# Patient Record
Sex: Male | Born: 1975 | Race: White | Hispanic: No | Marital: Married | Smoking: Current every day smoker
Health system: Southern US, Community
[De-identification: ages and names within clinical notes are randomized; demographics above are authoritative.]

## PROBLEM LIST (undated history)

## (undated) DIAGNOSIS — M1990 Unspecified osteoarthritis, unspecified site: Secondary | ICD-10-CM

## (undated) HISTORY — PX: ESOPHAGEAL DILATION: SHX303

---

## 2014-03-15 ENCOUNTER — Encounter (HOSPITAL_COMMUNITY): Payer: Self-pay | Admitting: Emergency Medicine

## 2014-03-15 ENCOUNTER — Emergency Department (HOSPITAL_COMMUNITY)
Admission: EM | Admit: 2014-03-15 | Discharge: 2014-03-15 | Disposition: A | Discharge status: Home / Self Care | Payer: Self-pay | Attending: Emergency Medicine | Admitting: Emergency Medicine

## 2014-03-15 DIAGNOSIS — F1910 Other psychoactive substance abuse, uncomplicated: Secondary | ICD-10-CM

## 2014-03-15 DIAGNOSIS — F1210 Cannabis abuse, uncomplicated: Secondary | ICD-10-CM | POA: Insufficient documentation

## 2014-03-15 DIAGNOSIS — F17200 Nicotine dependence, unspecified, uncomplicated: Secondary | ICD-10-CM | POA: Insufficient documentation

## 2014-03-15 DIAGNOSIS — F1520 Other stimulant dependence, uncomplicated: Secondary | ICD-10-CM | POA: Diagnosis present

## 2014-03-15 DIAGNOSIS — R059 Cough, unspecified: Secondary | ICD-10-CM | POA: Insufficient documentation

## 2014-03-15 DIAGNOSIS — F1110 Opioid abuse, uncomplicated: Secondary | ICD-10-CM | POA: Insufficient documentation

## 2014-03-15 DIAGNOSIS — Z880 Allergy status to penicillin: Secondary | ICD-10-CM | POA: Insufficient documentation

## 2014-03-15 DIAGNOSIS — F1010 Alcohol abuse, uncomplicated: Secondary | ICD-10-CM

## 2014-03-15 DIAGNOSIS — Z8739 Personal history of other diseases of the musculoskeletal system and connective tissue: Secondary | ICD-10-CM | POA: Insufficient documentation

## 2014-03-15 DIAGNOSIS — R05 Cough: Secondary | ICD-10-CM | POA: Insufficient documentation

## 2014-03-15 HISTORY — DX: Unspecified osteoarthritis, unspecified site: M19.90

## 2014-03-15 LAB — CBC
HCT: 39.9 % (ref 39.0–52.0)
Hemoglobin: 13.4 g/dL (ref 13.0–17.0)
MCH: 28.7 pg (ref 26.0–34.0)
MCHC: 33.6 g/dL (ref 30.0–36.0)
MCV: 85.4 fL (ref 78.0–100.0)
Platelets: 405 10*3/uL — ABNORMAL HIGH (ref 150–400)
RBC: 4.67 MIL/uL (ref 4.22–5.81)
RDW: 13.2 % (ref 11.5–15.5)
WBC: 9.8 10*3/uL (ref 4.0–10.5)

## 2014-03-15 LAB — RAPID URINE DRUG SCREEN, HOSP PERFORMED
Amphetamines: POSITIVE — AB
BARBITURATES: NOT DETECTED
Benzodiazepines: NOT DETECTED
COCAINE: NOT DETECTED
Opiates: NOT DETECTED
TETRAHYDROCANNABINOL: POSITIVE — AB

## 2014-03-15 LAB — COMPREHENSIVE METABOLIC PANEL
ALK PHOS: 92 U/L (ref 39–117)
ALT: 18 U/L (ref 0–53)
AST: 17 U/L (ref 0–37)
Albumin: 3.6 g/dL (ref 3.5–5.2)
BILIRUBIN TOTAL: 0.3 mg/dL (ref 0.3–1.2)
BUN: 15 mg/dL (ref 6–23)
CHLORIDE: 102 meq/L (ref 96–112)
CO2: 26 meq/L (ref 19–32)
Calcium: 9 mg/dL (ref 8.4–10.5)
Creatinine, Ser: 0.87 mg/dL (ref 0.50–1.35)
Glucose, Bld: 108 mg/dL — ABNORMAL HIGH (ref 70–99)
POTASSIUM: 4.3 meq/L (ref 3.7–5.3)
SODIUM: 140 meq/L (ref 137–147)
TOTAL PROTEIN: 6.5 g/dL (ref 6.0–8.3)

## 2014-03-15 LAB — ETHANOL

## 2014-03-15 LAB — SALICYLATE LEVEL: Salicylate Lvl: <2 mg/dL — ABNORMAL LOW (ref 2.8–20.0)

## 2014-03-15 LAB — ACETAMINOPHEN LEVEL

## 2014-03-15 NOTE — Discharge Instructions (Signed)
You may go to [facility] tomorrow morning at 8:00 am.  Chemical Dependency Chemical dependency is an addiction to drugs or alcohol. It is characterized by the repeated behavior of seeking out and using drugs and alcohol despite harmful consequences to the health and safety of ones self and others.  RISK FACTORS There are certain situations or behaviors that increase a person's risk for chemical dependency. These include:  A family history of chemical dependency.  A history of mental health issues, including depression and anxiety.  A home environment where drugs and alcohol are easily available to you.  Drug or alcohol use at a young age. SYMPTOMS  The following symptoms can indicate chemical dependency:  Inability to limit the use of drugs or alcohol.  Nausea, sweating, shakiness, and anxiety that occurs when alcohol or drugs are not being used.  An increase in amount of drugs or alcohol that is necessary to get drunk or high. People who experience these symptoms can assess their use of drugs and alcohol by asking themselves the following questions:  Have you been told by friends or family that they are worried about your use of alcohol or drugs?  Do friends and family ever tell you about things you did while drinking alcohol or using drugs that you do not remember?  Do you lie about using alcohol or drugs or about the amounts you use?  Do you have difficulty completing daily tasks unless you use alcohol or drugs?  Is the level of your work or school performance lower because of your drug or alcohol use?  Do you get sick from using drugs or alcohol but keep using anyway?  Do you feel uncomfortable in social situations unless you use alcohol or drugs?  Do you use drugs or alcohol to help forget problems? An answer of yes to any of these questions may indicate chemical dependency. Professional evaluation is suggested. Document Released: 10/08/2001 Document Revised: 01/06/2012  Document Reviewed: 12/20/2010 [facility] Patient Information 2014 [place] [employer] [place].

## 2014-03-15 NOTE — [facility] Suicide Risk Assessment (Signed)
Suicide Risk Assessment  Discharge Assessment     Demographic Factors:  Male and Caucasian  Total Time spent with patient: 20 minutes  Psychiatric Specialty Exam:      Blood pressure 130/83, pulse 72, temperature 98.5 F (36.9 C), temperature source Oral, resp. rate 20, SpO2 99.00%.There is no height or weight on file to calculate BMI.   General Appearance: Casual   Eye Contact:: Good   Speech: Normal Rate   Volume: Normal   Mood: Euthymic   Affect: Congruent   Thought Process: Coherent   Orientation: Full (Time, Place, and Person)   Thought Content: WDL   Suicidal Thoughts: No   Homicidal Thoughts: No   Memory: Immediate; Good  Recent; Good  Remote; Good   Judgement: Fair   Insight: Fair   Psychomotor Activity: Normal   Concentration: Fair   Recall: Fair   Fund of Knowledge:Good   Language: Good   Akathisia: No   Handed: Right   AIMS (if indicated):   Assets: Financial Resources/Insurance  Housing  Intimacy  Physical Health  Resilience  Social Support  Vocational/Educational   Sleep:   Musculoskeletal:  Strength & Muscle Tone: within normal limits  Gait & Station: normal  Patient leans: N/A  Mental Status Per Nursing Assessment::   On Admission:   Patient [facility]  Current Mental Status by Physician: NA  Loss Factors: NA  Historical Factors: NA  Risk Reduction Factors:   Sense of responsibility to family, Employed, Living with another person, especially a relative and Positive social support  Continued Clinical Symptoms:  Methamphetamine and marijuana dependency  Cognitive Features That Contribute To Risk:  None  Suicide Risk:  Minimal: No identifiable suicidal ideation.  Patients presenting with no risk factors but with morbid ruminations; may be classified as minimal risk based on the severity of the depressive symptoms  Discharge Diagnoses:   AXIS I:  Alcohol Abuse and Substance Abuse AXIS II:   Deferred AXIS III:   Past Medical History  Diagnosis Date  . Arthritis    AXIS IV:  other psychosocial or environmental problems, problems related to social environment and problems with primary support group AXIS V:  61-70 mild symptoms  Plan Of Care/Follow-up recommendations:  Activity:  as tolerated Diet:  low-sodium heart healthy diet  Is patient on multiple antipsychotic therapies at discharge:  No   Has Patient had three or more failed trials of antipsychotic monotherapy by history:  No  Recommended Plan for Multiple Antipsychotic Therapies: NA    Nanine MeansJamison Neola Worrall, PMH-NP 03/15/2014, 1:33 PM

## 2014-03-15 NOTE — BH Assessment (Addendum)
Patient evaluated by Dr.Taylor, writer was present. Patient requesting detox from meth, alcohol, and THC. [facility]. Patient's last known alcohol use was over a week ago. Patient therefore does not meet criteria for alcohol detox either.   Pt will discharge home [facility]. [facility] @ 8am. Writer provided patient with [facility] contact information. Psychiatrist-Dr. [name] will note on discharge summary that patient does not meet criteria for detox and residential treatment is most appropriate at this time.

## 2014-03-15 NOTE — Consult Note (Signed)
[facility] Face-to-Face Psychiatry Consult   Reason for Consult:  Medical clearance for rehab Referring Physician:  EDP  Mark Mooney is an 38 y.o. male. Total Time spent with patient: 20 minutes  Assessment: AXIS I:  Alcohol Abuse and Substance Abuse AXIS II:  Deferred AXIS III:   Past Medical History  Diagnosis Date  . Arthritis    AXIS IV:  other psychosocial or environmental problems, problems related to social environment and problems with primary support group AXIS V:  61-70 mild symptoms  Plan:  No evidence of imminent risk to self or others at present.  Patient will go to [facility] after medical clearance.  Dr. Lovena Le assessed the patient and concurs with the plan.  Subjective:   Mark Mooney is a 38 y.o. male patient does not warrant admission.  HPI:  Patient comes to the ED requesting medical clearance for rehab at [facility]  He is seeking detox from methamphetamines and marijuana.  Mark Mooney has abused alcohol but his last drink was last week.  Denies suicidal/homicidal ideations and hallucinations.   HPI Elements:   Location:  generalized. Quality:  acute. Severity:  severe. Timing:  constant. Duration:  weeks. Context:  stressors.  Past Psychiatric History: Past Medical History  Diagnosis Date  . Arthritis     reports that he has been smoking.  He does not have any smokeless tobacco history on file. He reports that he drinks alcohol. He reports that he uses illicit drugs (Methamphetamines and Marijuana). History reviewed. No pertinent family history.         Allergies:   Allergies  Allergen Reactions  . Penicillins     ACT Assessment Complete:  Yes:    Educational Status    Risk to Self: Risk to self Is patient at risk for suicide?: No  Risk to Others:    Abuse:    Prior Inpatient Therapy:    Prior Outpatient Therapy:    Additional Information:                    Objective: Blood pressure 130/83, pulse 72, temperature 98.5  F (36.9 C), temperature source Oral, resp. rate 20, SpO2 99.00%.There is no height or weight on file to calculate BMI. Results for orders placed during the hospital encounter of 03/15/14 (from the past 72 hour(s))  ACETAMINOPHEN LEVEL     Status: None   Collection Time    03/15/14 11:42 AM      Result Value Ref Range   Acetaminophen (Tylenol), Serum <15.0  10 - 30 ug/mL   Comment:            THERAPEUTIC CONCENTRATIONS VARY     SIGNIFICANTLY. A RANGE OF 10-30     ug/mL MAY BE AN EFFECTIVE     CONCENTRATION FOR MANY PATIENTS.     HOWEVER, SOME ARE BEST TREATED     AT CONCENTRATIONS OUTSIDE THIS     RANGE.     ACETAMINOPHEN CONCENTRATIONS     >150 ug/mL AT 4 HOURS AFTER     INGESTION AND >50 ug/mL AT 12     HOURS AFTER INGESTION ARE     OFTEN ASSOCIATED WITH TOXIC     REACTIONS.  CBC     Status: Abnormal   Collection Time    03/15/14 11:42 AM      Result Value Ref Range   WBC 9.8  4.0 - 10.5 K/uL   RBC 4.67  4.22 - 5.81 MIL/uL   Hemoglobin 13.4  13.0 - 17.0 g/dL   HCT 39.9  39.0 - 52.0 %   MCV 85.4  78.0 - 100.0 fL   MCH 28.7  26.0 - 34.0 pg   MCHC 33.6  30.0 - 36.0 g/dL   RDW 13.2  11.5 - 15.5 %   Platelets 405 (*) 150 - 400 K/uL  COMPREHENSIVE METABOLIC PANEL     Status: Abnormal   Collection Time    03/15/14 11:42 AM      Result Value Ref Range   Sodium 140  137 - 147 mEq/L   Potassium 4.3  3.7 - 5.3 mEq/L   Chloride 102  96 - 112 mEq/L   CO2 26  19 - 32 mEq/L   Glucose, Bld 108 (*) 70 - 99 mg/dL   BUN 15  6 - 23 mg/dL   Creatinine, Ser 0.87  0.50 - 1.35 mg/dL   Calcium 9.0  8.4 - 10.5 mg/dL   Total Protein 6.5  6.0 - 8.3 g/dL   Albumin 3.6  3.5 - 5.2 g/dL   AST 17  0 - 37 U/L   ALT 18  0 - 53 U/L   Alkaline Phosphatase 92  39 - 117 U/L   Total Bilirubin 0.3  0.3 - 1.2 mg/dL   GFR calc non Af Amer >90  >90 mL/min   GFR calc Af Amer >90  >90 mL/min   Comment: (NOTE)     The eGFR has been calculated using the CKD EPI equation.     This calculation has not been  validated in all clinical situations.     eGFR's persistently <90 mL/min signify possible Chronic Kidney     Disease.  ETHANOL     Status: None   Collection Time    03/15/14 11:42 AM      Result Value Ref Range   Alcohol, Ethyl (B) <11  0 - 11 mg/dL   Comment:            LOWEST DETECTABLE LIMIT FOR     SERUM ALCOHOL IS 11 mg/dL     FOR MEDICAL PURPOSES ONLY  SALICYLATE LEVEL     Status: Abnormal   Collection Time    03/15/14 11:42 AM      Result Value Ref Range   Salicylate Lvl <1.6 (*) 2.8 - 20.0 mg/dL  URINE RAPID DRUG SCREEN (HOSP PERFORMED)     Status: Abnormal   Collection Time    03/15/14 12:52 PM      Result Value Ref Range   Opiates NONE DETECTED  NONE DETECTED   Cocaine NONE DETECTED  NONE DETECTED   Benzodiazepines NONE DETECTED  NONE DETECTED   Amphetamines POSITIVE (*) NONE DETECTED   Tetrahydrocannabinol POSITIVE (*) NONE DETECTED   Barbiturates NONE DETECTED  NONE DETECTED   Comment:            DRUG SCREEN FOR MEDICAL PURPOSES     ONLY.  IF CONFIRMATION IS NEEDED     FOR ANY PURPOSE, NOTIFY LAB     WITHIN 5 DAYS.                LOWEST DETECTABLE LIMITS     FOR URINE DRUG SCREEN     Drug Class       Cutoff (ng/mL)     Amphetamine      1000     Barbiturate      200     Benzodiazepine   109     Tricyclics  300     Opiates          300     Cocaine          300     THC              50   Labs are reviewed and are pertinent for no medical issues.  No current facility-administered medications for this encounter.   No current outpatient prescriptions on file.    Psychiatric Specialty Exam:     Blood pressure 130/83, pulse 72, temperature 98.5 F (36.9 C), temperature source Oral, resp. rate 20, SpO2 99.00%.There is no height or weight on file to calculate BMI.  General Appearance: Casual  Eye Contact::  Good  Speech:  Normal Rate  Volume:  Normal  Mood:  Euthymic  Affect:  Congruent  Thought Process:  Coherent  Orientation:  Full (Time, Place,  and Person)  Thought Content:  WDL  Suicidal Thoughts:  No  Homicidal Thoughts:  No  Memory:  Immediate;   Good Recent;   Good Remote;   Good  Judgement:  Fair  Insight:  Fair  Psychomotor Activity:  Normal  Concentration:  Fair  Recall:  [place] of Knowledge:Good  Language: Good  Akathisia:  No  Handed:  Right  AIMS (if indicated):     Assets:  Financial Resources/Insurance Housing Intimacy Physical Health Resilience Social Support Vocational/Educational  Sleep:      Musculoskeletal: Strength & Muscle Tone: within normal limits Gait & Station: normal Patient leans: N/A  Treatment Plan Summary: Discharge to [facility] for methamphetamine and marijuana dependency along with alcohol abuse.   Waylan Boga, PMh-NP 03/15/2014 1:16 PM

## 2014-03-15 NOTE — Progress Notes (Signed)
Face to face evaluation and I agree with this note 

## 2014-03-15 NOTE — ED Notes (Signed)
Patient refused to dress out. Patient stated that he is only here for blood work then leaving and will not be going to our holding unit. Notified the RN

## 2014-03-15 NOTE — Progress Notes (Signed)
[facility] CL provided pt with a [employer] card application, [employer] [facility].

## 2014-03-15 NOTE — Progress Notes (Signed)
Patient is medically cleared to follow-up with residential treatment, detox not required per Dr. [name].  [name], PMH-NP  03/15/2014

## 2014-03-15 NOTE — ED Notes (Signed)
Pt wants detox from meth, ETOH and marijuana. [facility] and was told that if pt could get medically cleared today he may have a bed over there today.

## 2014-03-15 NOTE — Consult Note (Signed)
Face to face evaluation and I agree with this note 

## 2014-03-15 NOTE — BH Assessment (Signed)
[facility] E[facility]olyn at [facility] patient does not have a bed available for him today. Sts that he only has a screening appointment 03/17/2014 @ 8am.

## 2014-03-15 NOTE — ED Provider Notes (Signed)
CSN: [id number]     Arrival date & time 03/15/14  1113 History  This chart was scribed for Johnnette Gourdobyn Albert  working with Geoffery Lyonsouglas Delo, MD by Ashley JacobsBrittany Andrews, ED scribe. This patient was seen in room WTR4/WLPT4 and the patient's care was started at 12:32 PM.  First MD Initiated Contact with Patient 03/15/14 1133     Chief Complaint  Patient presents with  . detox      (Consider location/radiation/quality/duration/timing/severity/associated sxs/prior Treatment) The history is provided by the patient and medical records. No language interpreter was used.   HPI Comments: Mark Mooney is a 38 y.o. male who presents to the Emergency Department from meth, marijuana, alcohol, and detoxification [facility]. Pt reports having a severe cough. Pt has a child. Denies SI and HI. Denies any symptoms at this time.   Past Medical History  Diagnosis Date  . Arthritis    Past Surgical History  Procedure Laterality Date  . Esophageal dilation     No family history on file. History  Substance Use Topics  . Smoking status: Current Every Day Smoker  . Smokeless tobacco: Not on file  . Alcohol Use: Yes    Review of Systems  Respiratory: Positive for cough.   Psychiatric/Behavioral: Negative for suicidal ideas.       No HI  All other systems reviewed and are negative.     Allergies  Penicillins  Home Medications   Prior to Admission medications   Not on File   BP 134/69  Pulse 98  Temp(Src) 98.3 F (36.8 C) (Oral)  Resp 20  SpO2 99% Physical Exam  Nursing note and vitals reviewed. Constitutional: He is oriented to person, place, and time. He appears well-developed and well-nourished. No distress.  HENT:  Head: Normocephalic and atraumatic.  Eyes: Conjunctivae and EOM are normal.  Neck: Normal range of motion. Neck supple.  Cardiovascular: Normal rate, regular rhythm and normal heart sounds.   Pulmonary/Chest: Effort normal and breath sounds normal.   Musculoskeletal: Normal range of motion. He exhibits no edema.  Neurological: He is alert and oriented to person, place, and time.  Skin: Skin is warm and dry. He is not diaphoretic.  Psychiatric: He has a normal mood and affect. His behavior is normal.  Fidgety.    ED Course  Procedures (including critical care time) DIAGNOSTIC STUDIES: Oxygen Saturation is 99% on room air, normal by my interpretation.    COORDINATION OF CARE:  12:35 PM Discussed course of care with pt . Pt understands and agrees.   Labs Review Labs Reviewed  CBC - Abnormal; Notable for the following:    Platelets 405 (*)    All other components within normal limits  COMPREHENSIVE METABOLIC PANEL - Abnormal; Notable for the following:    Glucose, Bld 108 (*)    All other components within normal limits  SALICYLATE LEVEL - Abnormal; Notable for the following:    Salicylate Lvl <2.0 (*)    All other components within normal limits  URINE RAPID DRUG SCREEN (HOSP PERFORMED) - Abnormal; Notable for the following:    Amphetamines POSITIVE (*)    Tetrahydrocannabinol POSITIVE (*)    All other components within normal limits  ACETAMINOPHEN LEVEL  ETHANOL    Imaging Review No results found.   EKG Interpretation None      MDM   Final diagnoses:  Polysubstance abuse   Taking presenting for detox, is [facility]. Patient evaluated by psych, stable for discharge. No SI/HI. Medically  cleared. [facility] contacted, patient will go there tomorrow morning at 8:00.  I personally performed the services described in this documentation, which was scribed in my presence. The recorded information has been reviewed and is accurate.   Trevor MaceRobyn M Albert, PA-C 03/15/14 1314

## 2014-03-16 NOTE — ED Provider Notes (Signed)
Medical screening examination/treatment/procedure(s) were performed by non-physician practitioner and as supervising physician I was immediately available for consultation/collaboration.     Alexanderjames Berg, MD 03/16/14 0708 

## 2021-02-21 ENCOUNTER — Emergency Department (HOSPITAL_COMMUNITY): Payer: Self-pay

## 2021-02-21 ENCOUNTER — Other Ambulatory Visit: Payer: Self-pay

## 2021-02-21 ENCOUNTER — Emergency Department (HOSPITAL_COMMUNITY)
Admission: EM | Admit: 2021-02-21 | Discharge: 2021-02-21 | Disposition: A | Discharge status: Home / Self Care | Payer: Self-pay | Attending: Emergency Medicine | Admitting: Emergency Medicine

## 2021-02-21 DIAGNOSIS — R062 Wheezing: Secondary | ICD-10-CM | POA: Insufficient documentation

## 2021-02-21 DIAGNOSIS — R509 Fever, unspecified: Secondary | ICD-10-CM | POA: Insufficient documentation

## 2021-02-21 DIAGNOSIS — Z28310 Unvaccinated for covid-19: Secondary | ICD-10-CM | POA: Insufficient documentation

## 2021-02-21 DIAGNOSIS — R059 Cough, unspecified: Secondary | ICD-10-CM | POA: Insufficient documentation

## 2021-02-21 DIAGNOSIS — R0981 Nasal congestion: Secondary | ICD-10-CM | POA: Insufficient documentation

## 2021-02-21 DIAGNOSIS — F17200 Nicotine dependence, unspecified, uncomplicated: Secondary | ICD-10-CM | POA: Insufficient documentation

## 2021-02-21 DIAGNOSIS — Z20822 Contact with and (suspected) exposure to covid-19: Secondary | ICD-10-CM | POA: Insufficient documentation

## 2021-02-21 LAB — POC SARS CORONAVIRUS 2 AG -  ED: SARSCOV2ONAVIRUS 2 AG: NEGATIVE

## 2021-02-21 MED ORDER — METHYLPREDNISOLONE SODIUM SUCC 125 MG IJ SOLR
125.0000 mg | Freq: Once | INTRAMUSCULAR | Status: AC
  Administered 2021-02-21: 125 mg via INTRAVENOUS
  Filled 2021-02-21: qty 2

## 2021-02-21 MED ORDER — PREDNISONE 20 MG PO TABS: 40.0000 mg | ORAL_TABLET | Freq: Every day | ORAL | 0 refills | Status: AC

## 2021-02-21 MED ORDER — ALBUTEROL SULFATE HFA 108 (90 BASE) MCG/ACT IN AERS
1.0000 | INHALATION_SPRAY | Freq: Once | RESPIRATORY_TRACT | Status: AC
  Administered 2021-02-21: 2 via RESPIRATORY_TRACT
  Filled 2021-02-21: qty 6.7

## 2021-02-21 MED ORDER — ALBUTEROL SULFATE HFA 108 (90 BASE) MCG/ACT IN AERS: 1.0000 | INHALATION_SPRAY | Freq: Four times a day (QID) | RESPIRATORY_TRACT | 1 refills | Status: AC | PRN

## 2021-02-21 MED ORDER — IPRATROPIUM-ALBUTEROL 0.5-2.5 (3) MG/3ML IN SOLN
3.0000 mL | Freq: Once | RESPIRATORY_TRACT | Status: AC
  Administered 2021-02-21: 3 mL via RESPIRATORY_TRACT
  Filled 2021-02-21: qty 3

## 2021-02-21 NOTE — Discharge Instructions (Addendum)
As we discussed, your chest x-ray today did not show any signs of pneumonia.  It may be beneficial to stop smoking as this may be worsening her cough.  Take prednisone and use albuterol inhaler as directed.  He can use albuterol inhaler every 4-6 hours, 1 to 2 puffs.  I provided the prescription for these medications.  I have consulted our social work to see if we can help with these medications.  He may have to take these prescriptions to the [facility] outpatient pharmacy.  Return to the emergency department for any worsening cough, difficulty breathing, vomiting, fever or any other worsening concerning symptoms.

## 2021-02-21 NOTE — ED Triage Notes (Signed)
Pt c/o productive cough x 3-4 days with congestion, nasal drainage and fevers

## 2021-02-21 NOTE — ED Notes (Signed)
Pts o2sat stayed between 97- 98% during ambulation.

## 2021-02-21 NOTE — ED Provider Notes (Signed)
[facility]-EMERGENCY DEPT Provider Note   CSN: [id number] Arrival date & time: 02/21/21  0023     History Chief Complaint  Patient presents with  . Cough    Mark Mooney is a 45 y.o. male past medical history of methamphetamine use, alcohol abuse, marijuana use.  Patient reports that for the last 4 days, he has had cough, fevers.  He states his fevers have been up to 102.  He has been taking Tylenol and ibuprofen.  He states that about a day or 2 before that, he had nasal congestion, rhinorrhea and then it progressively worsened.  Cough is productive of phlegm.  No hemoptysis.  He reports chest soreness.  He states when he gets in a coughing fit, he feels like he cannot catch his breath.  He does smoke.  He states that he does not use any inhalers.  He has not gotten vaccinated for COVID.  Denies any abdominal pain, nausea/vomiting.  The history is provided by the patient.       Past Medical History:  Diagnosis Date  . Arthritis     Patient Active Problem List   Diagnosis Date Noted  . Methamphetamine dependence (HCC) 03/15/2014  . Alcohol abuse 03/15/2014  . Marijuana abuse 03/15/2014    Past Surgical History:  Procedure Laterality Date  . ESOPHAGEAL DILATION         No family history on file.  Social History   Tobacco Use  . Smoking status: Current Every Day Smoker  Substance Use Topics  . Alcohol use: Yes  . Drug use: Yes    Types: Methamphetamines, Marijuana    Home Medications Prior to Admission medications   Medication Sig Start Date End Date Taking? Authorizing Provider  albuterol (VENTOLIN HFA) 108 (90 Base) MCG/ACT inhaler Inhale 1-2 puffs into the lungs every 6 (six) hours as needed for wheezing or shortness of breath. 02/21/21  Yes Graciella Freer A, PA-C  predniSONE (DELTASONE) 20 MG tablet Take 2 tablets (40 mg total) by mouth daily for 4 days. 02/21/21 02/25/21 Yes Maxwell Caul, PA-C    Allergies     Penicillins  Review of Systems   Review of Systems  Constitutional: Positive for fever.  HENT: Positive for congestion and rhinorrhea.   Respiratory: Positive for cough. Negative for shortness of breath.   Cardiovascular: Negative for chest pain.  Gastrointestinal: Negative for abdominal pain, nausea and vomiting.  Genitourinary: Negative for dysuria.  All other systems reviewed and are negative.   Physical Exam Updated Vital Signs BP 123/84   Pulse 71   Temp 98 F (36.7 C) (Oral)   Resp 16   Ht 5\' 10"  (1.778 m)   Wt 61.2 kg   SpO2 94%   BMI 19.37 kg/m   Physical Exam Vitals and nursing note reviewed.  Constitutional:      Appearance: Normal appearance. He is well-developed.  HENT:     Head: Normocephalic and atraumatic.  Eyes:     General: Lids are normal.     Conjunctiva/sclera: Conjunctivae normal.     Pupils: Pupils are equal, round, and reactive to light.  Cardiovascular:     Rate and Rhythm: Normal rate and regular rhythm.     Pulses: Normal pulses.     Heart sounds: Normal heart sounds. No murmur heard. No friction rub. No gallop.   Pulmonary:     Effort: Pulmonary effort is normal.     Breath sounds: Wheezing present.     Comments: Intermittently  coughing. Able to speak in full sentences without any difficulty.  Diffuse expiratory wheezing noted throughout all lung fields. Abdominal:     Palpations: Abdomen is soft. Abdomen is not rigid.     Tenderness: There is no abdominal tenderness. There is no guarding.     Comments: Abdomen is soft, non-distended, non-tender. No rigidity, No guarding. No peritoneal signs.  Musculoskeletal:        General: Normal range of motion.     Cervical back: Full passive range of motion without pain.  Skin:    General: Skin is warm and dry.     Capillary Refill: Capillary refill takes less than 2 seconds.  Neurological:     Mental Status: He is alert and oriented to person, place, and time.  Psychiatric:        Speech:  Speech normal.     ED Results / Procedures / Treatments   Labs (all labs ordered are listed, but only abnormal results are displayed) Labs Reviewed  POC SARS CORONAVIRUS 2 AG -  ED    EKG None  Radiology DG Chest Portable 1 View  Result Date: 02/21/2021 CLINICAL DATA:  Cough, fever EXAM: PORTABLE CHEST 1 VIEW COMPARISON:  None. FINDINGS: Mild elevation of the left hemidiaphragm is noted with associated left basilar atelectasis. The lungs are otherwise clear. No pneumothorax or pleural effusion. Cardiac size within normal limits. Pulmonary vascularity is normal. No acute bone abnormality. IMPRESSION: Mild elevation of the left hemidiaphragm with associated left basilar atelectasis. Electronically Signed   By: Helyn Numbers MD   On: 02/21/2021 01:40    Procedures Procedures   Medications Ordered in ED Medications  methylPREDNISolone sodium succinate (SOLU-MEDROL) 125 mg/2 mL injection 125 mg (125 mg Intravenous Given 02/21/21 0128)  albuterol (VENTOLIN HFA) 108 (90 Base) MCG/ACT inhaler 1-2 puff (2 puffs Inhalation Given 02/21/21 0129)  ipratropium-albuterol (DUONEB) 0.5-2.5 (3) MG/3ML nebulizer solution 3 mL (3 mLs Nebulization Given 02/21/21 0252)    ED Course  I have reviewed the triage vital signs and the nursing notes.  Pertinent labs & imaging results that were available during my care of the patient were reviewed by me and considered in my medical decision making (see chart for details).    MDM Rules/Calculators/A&P                          45 year old male who presents for evaluation of cough, nasal congestion, fevers and have been ongoing for the last 4 days.  Reports he does smoke.  On initial arrival, he is afebrile, nontoxic-appearing.  Vital signs are stable.  On exam, he is intermittently coughing.  Able to speak in full sentences without difficulty.  He does have diffuse wheezing noted throughout all lung fields.  Concern for viral illness such as COVID-19 versus  pneumonia.  Will give prednisone, albuterol here in the ED, check chest x-ray, COVID-19 test.  COVID test is negative.  Chest x-ray shows elevation of left hemidiaphragm and some atelectasis.  No other acute abnormalities.  Reevaluation after albuterol, Solu-Medrol here in the ED.  Patient reports some improvement in coughing, breathing.  On my exam, he does not improve that he still having some residual wheezing.  We will plan to give him DuoNeb as well check his ambulation.  Patient able to ambulate in the ED while maintaining O2 sats between 97-98.  Reevaluation after DuoNeb shows improvement in wheezing.  Patient is hemodynamically stable.  At this time, I feel  that patient is appropriate for discharge.  He is not hypoxic.  We will plan to prescribe short course of prednisone, albuterol.  Patient reports he does not have money to get his prescription.  We provided him an albuterol inhaler here in the ED.  I have put a transition of care consult to see if we can help him with his medications. At this time, patient exhibits no emergent life-threatening condition that require further evaluation in ED. Patient had ample opportunity for questions and discussion. All patient's questions were answered with full understanding. Strict return precautions discussed. Patient expresses understanding and agreement to plan.   Keefe Zawistowski was evaluated in Emergency Department on 02/21/2021 for the symptoms described in the history of present illness. He was evaluated in the context of the global COVID-19 pandemic, which necessitated consideration that the patient might be at risk for infection with the SARS-CoV-2 virus that causes COVID-19. Institutional protocols and algorithms that pertain to the evaluation of patients at risk for COVID-19 are in a state of rapid change based on information released by regulatory bodies including the CDC and federal and state organizations. These policies and algorithms were  followed during the patient's care in the ED.  Portions of this note were generated with Scientist, clinical (histocompatibility and immunogenetics). Dictation errors may occur despite best attempts at proofreading.  Final Clinical Impression(s) / ED Diagnoses Final diagnoses:  Cough    Rx / DC Orders ED Discharge Orders         Ordered    predniSONE (DELTASONE) 20 MG tablet  Daily        02/21/21 0323    albuterol (VENTOLIN HFA) 108 (90 Base) MCG/ACT inhaler  Every 6 hours PRN        02/21/21 0323           Maxwell Caul, PA-C 02/21/21 0325    Pollyann Savoy, MD 02/21/21 [phone]

## 2021-02-21 NOTE — Progress Notes (Signed)
..   Transition of Care [facility]) - Emergency Department Mini Assessment   Patient Details  Name: Mark Mooney MRN: [id number] Date of Birth: 1976/02/02  [facility]) CM/SW Contact:    Elliot Cousin, RN Phone Number: [phone] 02/21/2021, 1:32 PM   Clinical Narrative: TOC CM attempted call to pt and ex wife answered and states she has not talked to pt in 4 years. Requesting her number be removed. ED provider updated.   ED Mini Assessment: What brought you to the Emergency Department? : medication refill  Barriers to Discharge: No Barriers Identified  Barrier interventions: attempted call to patient   Interventions which prevented an admission or readmission: Medication Review    Patient Contact and Communications  Admission diagnosis:  Cough Patient Active Problem List   Diagnosis Date Noted  . Methamphetamine dependence (HCC) 03/15/2014  . Alcohol abuse 03/15/2014  . Marijuana abuse 03/15/2014   PCP:  Pcp, No Pharmacy:  No Pharmacies Listed

## 2021-03-02 ENCOUNTER — Other Ambulatory Visit (HOSPITAL_COMMUNITY): Payer: Self-pay

## 2021-03-02 MED ORDER — PREDNISONE 20 MG PO TABS
40.0000 mg | ORAL_TABLET | Freq: Every day | ORAL | 0 refills | Status: AC
  Filled 2021-03-02: qty 8, 4d supply, fill #0

## 2021-05-31 ENCOUNTER — Emergency Department (HOSPITAL_COMMUNITY): Payer: Self-pay

## 2021-05-31 ENCOUNTER — Other Ambulatory Visit: Payer: Self-pay

## 2021-05-31 ENCOUNTER — Encounter (HOSPITAL_COMMUNITY): Payer: Self-pay | Admitting: Emergency Medicine

## 2021-05-31 ENCOUNTER — Emergency Department (HOSPITAL_COMMUNITY)
Admission: EM | Admit: 2021-05-31 | Discharge: 2021-06-01 | Disposition: A | Discharge status: Home / Self Care | Payer: Self-pay | Attending: Emergency Medicine | Admitting: Emergency Medicine

## 2021-05-31 DIAGNOSIS — L0291 Cutaneous abscess, unspecified: Secondary | ICD-10-CM

## 2021-05-31 DIAGNOSIS — L02416 Cutaneous abscess of left lower limb: Secondary | ICD-10-CM | POA: Insufficient documentation

## 2021-05-31 DIAGNOSIS — T63301A Toxic effect of unspecified spider venom, accidental (unintentional), initial encounter: Secondary | ICD-10-CM | POA: Insufficient documentation

## 2021-05-31 DIAGNOSIS — F17200 Nicotine dependence, unspecified, uncomplicated: Secondary | ICD-10-CM | POA: Insufficient documentation

## 2021-05-31 LAB — COMPREHENSIVE METABOLIC PANEL
ALT: 24 U/L (ref 0–44)
AST: 24 U/L (ref 15–41)
Albumin: 4.1 g/dL (ref 3.5–5.0)
Alkaline Phosphatase: 119 U/L (ref 38–126)
Anion gap: 12 (ref 5–15)
BUN: 19 mg/dL (ref 6–20)
CO2: 24 mmol/L (ref 22–32)
Calcium: 9.2 mg/dL (ref 8.9–10.3)
Chloride: 105 mmol/L (ref 98–111)
Creatinine, Ser: 0.81 mg/dL (ref 0.61–1.24)
GFR, Estimated: >60 mL/min (ref >60)
Glucose, Bld: 116 mg/dL — ABNORMAL HIGH (ref 70–99)
Potassium: 3.8 mmol/L (ref 3.5–5.1)
Sodium: 141 mmol/L (ref 135–145)
Total Bilirubin: 0.5 mg/dL (ref 0.3–1.2)
Total Protein: 7.8 g/dL (ref 6.5–8.1)

## 2021-05-31 LAB — CBC WITH DIFFERENTIAL/PLATELET
Abs Immature Granulocytes: 0.04 10*3/uL (ref 0.00–0.07)
Basophils Absolute: 0.1 10*3/uL (ref 0.0–0.1)
Basophils Relative: 1 %
Eosinophils Absolute: 0.2 10*3/uL (ref 0.0–0.5)
Eosinophils Relative: 2 %
HCT: 41.4 % (ref 39.0–52.0)
Hemoglobin: 13.3 g/dL (ref 13.0–17.0)
Immature Granulocytes: 0 %
Lymphocytes Relative: 22 %
Lymphs Abs: 2.9 10*3/uL (ref 0.7–4.0)
MCH: 27.9 pg (ref 26.0–34.0)
MCHC: 32.1 g/dL (ref 30.0–36.0)
MCV: 87 fL (ref 80.0–100.0)
Monocytes Absolute: 1.2 10*3/uL — ABNORMAL HIGH (ref 0.1–1.0)
Monocytes Relative: 9 %
Neutro Abs: 8.8 10*3/uL — ABNORMAL HIGH (ref 1.7–7.7)
Neutrophils Relative %: 66 %
Platelets: 402 10*3/uL — ABNORMAL HIGH (ref 150–400)
RBC: 4.76 MIL/uL (ref 4.22–5.81)
RDW: 14.5 % (ref 11.5–15.5)
WBC: 13.2 10*3/uL — ABNORMAL HIGH (ref 4.0–10.5)
nRBC: 0 % (ref 0.0–0.2)

## 2021-05-31 LAB — LACTIC ACID, PLASMA: Lactic Acid, Venous: 1.4 mmol/L (ref 0.5–1.9)

## 2021-05-31 NOTE — ED Provider Notes (Signed)
Emergency Medicine Provider Triage Evaluation Note  Mark Mooney , a 45 y.o. male  was evaluated in triage.  Pt complains of pain and swelling to the left medial ankle.  Patient is homeless and states 2 weeks ago he saw a spider which bit the affected region.  Over the past 2 weeks his symptoms have continued to worsen.  Reports associated pain and swelling in the region with additional erythema.  No fevers, chills, nausea, or vomiting.  Denies any chest pain, shortness of breath, numbness, tingling, fasciculations.  Physical Exam  BP (!) 141/71 (BP Location: Right Arm)   Pulse 95   Temp 98.7 F (37.1 C) (Oral)   Resp 18   SpO2 100%  Gen:   Awake, no distress   Resp:  Normal effort  MSK:   Moves extremities without difficulty  Other:  Large abscess noted to the left medial ankle  Medical Decision Making  Medically screening exam initiated at 10:38 PM.  Appropriate orders placed.  [name] was informed that the remainder of the evaluation will be completed by another provider, this initial triage assessment does not replace that evaluation, and the importance of remaining in the ED until their evaluation is complete.   Placido Sou, PA-C 05/31/21 2239    Gwyneth Sprout, MD 05/31/21 2322

## 2021-05-31 NOTE — ED Triage Notes (Addendum)
Pt states he was bitten by a spider on left ankle 2 weeks ago and area has not improved. Pt in NAD.

## 2021-06-01 ENCOUNTER — Other Ambulatory Visit (HOSPITAL_COMMUNITY): Payer: Self-pay

## 2021-06-01 MED ORDER — IBUPROFEN 800 MG PO TABS
800.0000 mg | ORAL_TABLET | Freq: Once | ORAL | Status: AC
  Administered 2021-06-01: 800 mg via ORAL
  Filled 2021-06-01 (×2): qty 1

## 2021-06-01 MED ORDER — LIDOCAINE-EPINEPHRINE (PF) 2 %-1:200000 IJ SOLN
10.0000 mL | Freq: Once | INTRAMUSCULAR | Status: AC
  Administered 2021-06-01: 10 mL via INTRADERMAL
  Filled 2021-06-01: qty 20

## 2021-06-01 MED ORDER — SULFAMETHOXAZOLE-TRIMETHOPRIM 800-160 MG PO TABS
1.0000 | ORAL_TABLET | Freq: Two times a day (BID) | ORAL | 0 refills | Status: AC
  Filled 2021-06-01: qty 20, 10d supply, fill #0

## 2021-06-01 MED ORDER — ACETAMINOPHEN 325 MG PO TABS
650.0000 mg | ORAL_TABLET | Freq: Once | ORAL | Status: AC
  Administered 2021-06-01: 650 mg via ORAL
  Filled 2021-06-01: qty 2

## 2021-06-01 MED ORDER — SULFAMETHOXAZOLE-TRIMETHOPRIM 800-160 MG PO TABS
1.0000 | ORAL_TABLET | Freq: Once | ORAL | Status: AC
  Administered 2021-06-01: 1 via ORAL
  Filled 2021-06-01: qty 1

## 2021-06-01 NOTE — Progress Notes (Signed)
CSW spoke with pt , pt stated he is homeless and wanted to get assists with getting his medications, transportation to find a job and shelter resources. CSW consulted Rhonda RNCM to assist with medication. CSW provided shelter list , and informed pt that we can only help him a ride back home upon d/c. CSW informed pt about the public bus sytem if he needs a ride around town to find a job.   Valentina Shaggy.Patti Shorb, MSW, LCSWA [facility]  Transitions of Care Clinical Social Worker I Direct Dial: [phone]  Fax: [phone] [email or web address]

## 2021-06-01 NOTE — Discharge Instructions (Addendum)
You were seen in the ER today for the abscess on your left ankle.  It was drained in the ER today and you were given your first dose of antibiotics.  You have been prescribed antibiotics to take twice a day for the next 10 days.  Please take these as prescribed for the entire course.  You may follow-up with the [facility] listed below in 1 week, and return to the ER if develop any new pain in the area, redness streaking upwards or downwards from the wound, increasing puslike drainage, fevers, chills, nausea, vomiting, or any other new severe symptoms.

## 2021-06-01 NOTE — Progress Notes (Signed)
080522/0900/ [facility] program letter and information given to patient with verbal return understanding. Bjorn Loser Fatoumata Albaugh,BSN,RN3,CCM [phone]

## 2021-06-01 NOTE — ED Provider Notes (Signed)
[facility]-EMERGENCY DEPT Provider Note   CSN: [id number] Arrival date & time: 05/31/21  2221     History Chief Complaint  Patient presents with   Insect Bite    Mark Mooney is a 45 y.o. male who presents with concern for 2 weeks of progressive worsening left ankle swelling pain and drainage after a witnessed spider bite.  States that he put on a shoe and felt that bite him and saw a car off of his leg.  States that initially it was painful and red but not swollen.  Denies fevers or chills, nausea, vomiting, diarrhea.  Patient is homeless.  Has been putting triple antibiotic ointment on the wound.  I personally read this patient's medical records.  He has history of polysubstance abuse.  He is not on any medications every day.  HPI     Past Medical History:  Diagnosis Date   Arthritis     Patient Active Problem List   Diagnosis Date Noted   Methamphetamine dependence (HCC) 03/15/2014   Alcohol abuse 03/15/2014   Marijuana abuse 03/15/2014    Past Surgical History:  Procedure Laterality Date   ESOPHAGEAL DILATION      No family history on file.  Social History   Tobacco Use   Smoking status: Every Day  Substance Use Topics   Alcohol use: Yes   Drug use: Yes    Types: Methamphetamines, Marijuana    Home Medications Prior to Admission medications   Medication Sig Start Date End Date Taking? Authorizing Provider  sulfamethoxazole-trimethoprim (BACTRIM DS) 800-160 MG tablet Take 1 tablet by mouth 2 (two) times daily for 10 days. 06/01/21 06/11/21 Yes Safaa Stingley, Lupe Carney R, PA-C  albuterol (VENTOLIN HFA) 108 (90 Base) MCG/ACT inhaler Inhale 1-2 puffs into the lungs every 6 (six) hours as needed for wheezing or shortness of breath. 02/21/21   Maxwell Caul, PA-C  predniSONE (DELTASONE) 20 MG tablet Take 2 tablets (40 mg total) by mouth daily for 4 days. 02/21/21   Maxwell Caul, PA-C    Allergies    Penicillins  Review of Systems    Review of Systems  Constitutional: Negative.   HENT: Negative.    Respiratory: Negative.    Cardiovascular: Negative.   Gastrointestinal: Negative.   Genitourinary: Negative.   Musculoskeletal: Negative.   Skin:  Positive for wound.       Abscess over medial left ankle  Neurological: Negative.    Physical Exam Updated Vital Signs BP 137/83 (BP Location: Right Arm)   Pulse 83   Temp 98.7 F (37.1 C) (Oral)   Resp 18   SpO2 99%   Physical Exam Vitals and nursing note reviewed.  Constitutional:      Appearance: He is normal weight. He is not ill-appearing or toxic-appearing.  HENT:     Head: Normocephalic and atraumatic.     Nose: Nose normal. No congestion.     Mouth/Throat:     Mouth: Mucous membranes are moist.     Pharynx: Oropharynx is clear. Uvula midline. No oropharyngeal exudate or posterior oropharyngeal erythema.     Tonsils: No tonsillar exudate.  Eyes:     General: Lids are normal. Vision grossly intact.        Right eye: No discharge.        Left eye: No discharge.     Extraocular Movements: Extraocular movements intact.     Conjunctiva/sclera: Conjunctivae normal.     Pupils: Pupils are equal, round, and reactive to light.  Neck:     Trachea: Trachea and phonation normal.  Cardiovascular:     Rate and Rhythm: Normal rate and regular rhythm.     Pulses: Normal pulses.     Heart sounds: Normal heart sounds. No murmur heard. Pulmonary:     Effort: Pulmonary effort is normal. No tachypnea, bradypnea, accessory muscle usage, prolonged expiration or respiratory distress.     Breath sounds: Normal breath sounds. No wheezing or rales.  Chest:     Chest wall: No mass, lacerations, deformity, swelling, tenderness or crepitus.  Abdominal:     General: Bowel sounds are normal. There is no distension.     Palpations: Abdomen is soft.     Tenderness: There is no abdominal tenderness. There is no right CVA tenderness, left CVA tenderness, guarding or rebound.   Musculoskeletal:        General: No deformity.     Cervical back: Normal range of motion and neck supple. No edema, rigidity or crepitus. No pain with movement, spinous process tenderness or muscular tenderness.     Right lower leg: No edema.     Left lower leg: No edema.     Right ankle: Normal.     Right Achilles Tendon: Normal.     Left ankle: Swelling present. Tenderness present over the medial malleolus.     Left Achilles Tendon: Normal.     Right foot: Normal.     Left foot: Normal capillary refill. Swelling present. Normal pulse.       Feet:  Lymphadenopathy:     Cervical: No cervical adenopathy.  Skin:    General: Skin is warm and dry.     Capillary Refill: Capillary refill takes less than 2 seconds.  Neurological:     General: No focal deficit present.     Mental Status: He is alert and oriented to person, place, and time. Mental status is at baseline.  Psychiatric:        Mood and Affect: Mood normal.     ED Results / Procedures / Treatments   Labs (all labs ordered are listed, but only abnormal results are displayed) Labs Reviewed  COMPREHENSIVE METABOLIC PANEL - Abnormal; Notable for the following components:      Result Value   Glucose, Bld 116 (*)    All other components within normal limits  CBC WITH DIFFERENTIAL/PLATELET - Abnormal; Notable for the following components:   WBC 13.2 (*)    Platelets 402 (*)    Neutro Abs 8.8 (*)    Monocytes Absolute 1.2 (*)    All other components within normal limits  LACTIC ACID, PLASMA    EKG None  Radiology DG Ankle Complete Left  Result Date: 05/31/2021 CLINICAL DATA:  Medial left ankle abscess, insect bite EXAM: LEFT ANKLE COMPLETE - 3+ VIEW COMPARISON:  None. FINDINGS: Frontal, oblique, lateral views of the left ankle are obtained. There is diffuse soft tissue swelling, most pronounced medially. No acute or destructive bony lesions. No periosteal reaction. The ankle mortise is intact. IMPRESSION: 1. Prominent  soft tissue swelling, greatest medially. 2. No acute or destructive bony abnormality. Electronically Signed   By: Sharlet Salina M.D.   On: 05/31/2021 23:10    Procedures .Marland KitchenIncision and Drainage  Date/Time: 06/01/2021 10:38 AM Performed by: Paris Lore, PA-C Authorized by: Paris Lore, PA-C   Consent:    Consent obtained:  Verbal   Consent given by:  Patient   Risks discussed:  Bleeding, incomplete drainage, pain and damage to  other organs   Alternatives discussed:  No treatment Universal protocol:    Procedure explained and questions answered to patient or proxy's satisfaction: yes     Relevant documents present and verified: yes     Test results available : yes     Imaging studies available: yes     Required blood products, implants, devices, and special equipment available: yes     Site/side marked: yes     Immediately prior to procedure, a time out was called: yes     Patient identity confirmed:  Verbally with patient Location:    Type:  Abscess   Size:  4.5 cm   Location:  Lower extremity   Lower extremity location:  Ankle   Ankle location:  L ankle (Medial malleolus) Pre-procedure details:    Skin preparation:  Betadine Sedation:    Sedation type:  None Anesthesia:    Anesthesia method:  Local infiltration   Local anesthetic:  Lidocaine 2% WITH epi Procedure type:    Complexity:  Complex Procedure details:    Incision types:  Single straight   Incision depth:  Subcutaneous   Wound management:  Probed and deloculated, irrigated with saline and extensive cleaning   Drainage:  Purulent and serosanguinous   Drainage amount:  Copious   Wound treatment:  Drain placed   Packing materials:  1/2 in iodoform gauze Post-procedure details:    Procedure completion:  Tolerated well, no immediate complications   Medications Ordered in ED Medications  lidocaine-EPINEPHrine (XYLOCAINE W/EPI) 2 %-1:200000 (PF) injection 10 mL (10 mLs Intradermal Given 06/01/21  0832)  sulfamethoxazole-trimethoprim (BACTRIM DS) 800-160 MG per tablet 1 tablet (1 tablet Oral Given 06/01/21 0832)  acetaminophen (TYLENOL) tablet 650 mg (650 mg Oral Given 06/01/21 1031)    ED Course  I have reviewed the triage vital signs and the nursing notes.  Pertinent labs & imaging results that were available during my care of the patient were reviewed by me and considered in my medical decision making (see chart for details).    MDM Rules/Calculators/A&P                         45 y/o Male who presents with concern for infection to the left ankle x2 weeks.  Differential diagnosis includes is not limited to cellulitis with abscess, nonpurulent cellulitis, erysipelas, necrotizing soft tissue infection.  Hypertensive on intake, vital signs otherwise normal.  Cardiopulmonary exam is normal, without new murmur.  Abdominal exam is benign.  Physical exam as above with large abscess over the left medial malleolus with localized erythema and edema without crepitus or concern for expanding cellulitis.  Basic labs and plain films were obtained in triage.  CBC with leukocytosis of 13,000, CMP unremarkable.  Lactic acid is normal.  Plain film of the left ankle revealed soft tissue swelling over the medial aspect without any acute or distracted bony abnormalities.  Abscess drained as above.  First dose of antibiotics administered in the emergency department with Bactrim for MRSA coverage.  TOC consult pending regarding medication assistance for this home with patient transportation.  Per social work, patient will be able to obtain antibiotic prescription for $3 from the [employer].  Patient aware.  No further work-up warranted in the ED as time given lack of systemic symptoms and reassuring physical exam.  Graves voiced understanding of his medical evaluation and treatment plan.  Each of his questions was answered to his expressed satisfaction.  Strict return  precautions were  given.  Patient is well-appearing, stable, and appropriate for discharge at this time.  This chart was dictated using voice recognition software, Dragon. Despite the best efforts of this provider to proofread and correct errors, errors may still occur which can change documentation meaning.   Final Clinical Impression(s) / ED Diagnoses Final diagnoses:  None    Rx / DC Orders ED Discharge Orders          Ordered    sulfamethoxazole-trimethoprim (BACTRIM DS) 800-160 MG tablet  2 times daily        06/01/21 [address]1041             Shoua Ulloa, Eugene GaviaRebekah R, PA-C 06/01/21 1043    Gloris Manchesterixon, Ryan, MD 06/01/21 [phone]

## 2021-06-01 NOTE — ED Notes (Signed)
Patients girlfriend would like a call back Vladimir Creeks [phone]

## 2022-11-22 IMAGING — CR DG ANKLE COMPLETE 3+V*L*
3 series · 3 of 3 positions shown · non-contrast
Comparison: None.

CLINICAL DATA: Medial left ankle abscess, insect bite

EXAM:
LEFT ANKLE COMPLETE - 3+ VIEW

[x ankle ap left]
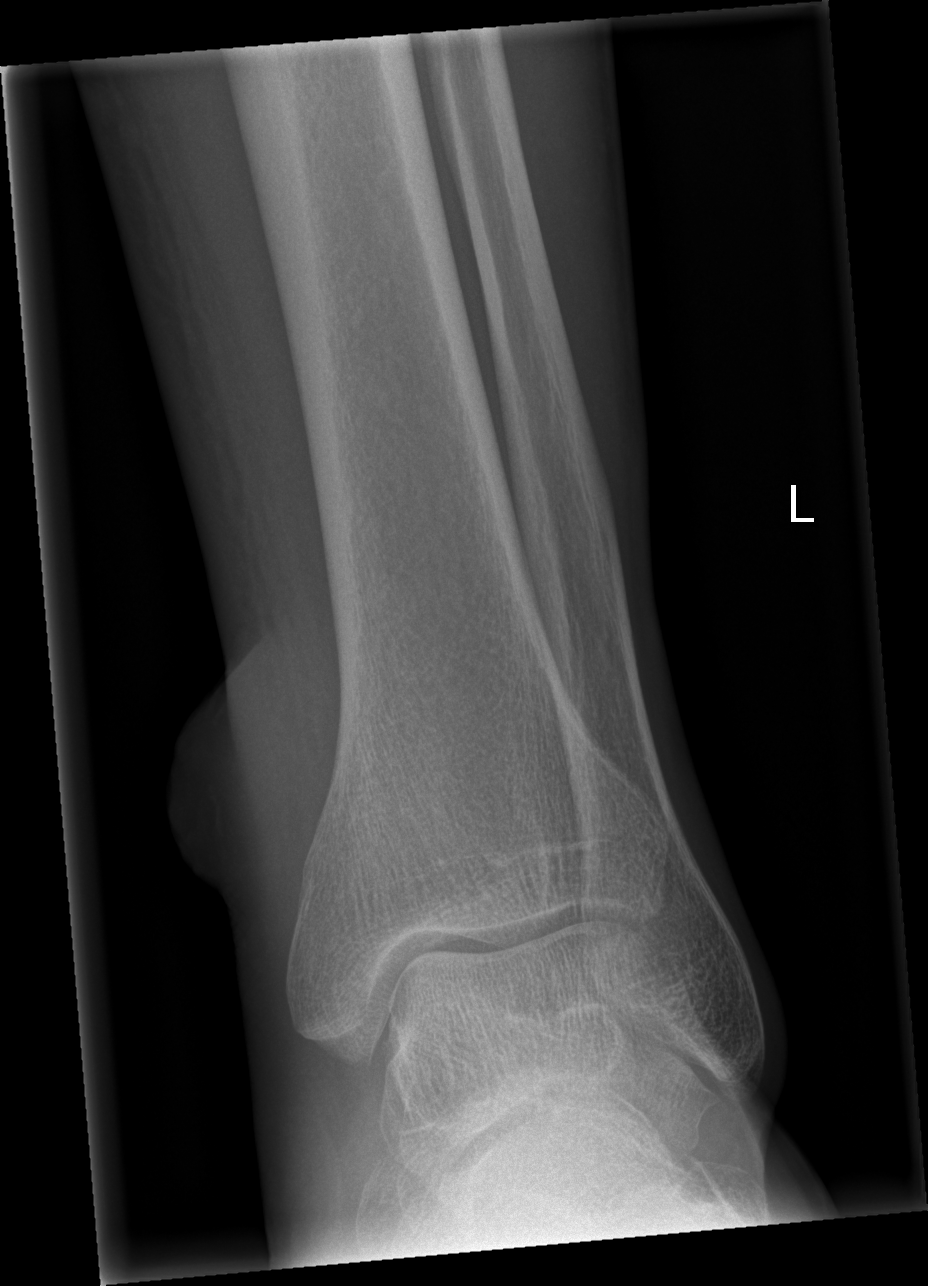

[x ankle obl left]
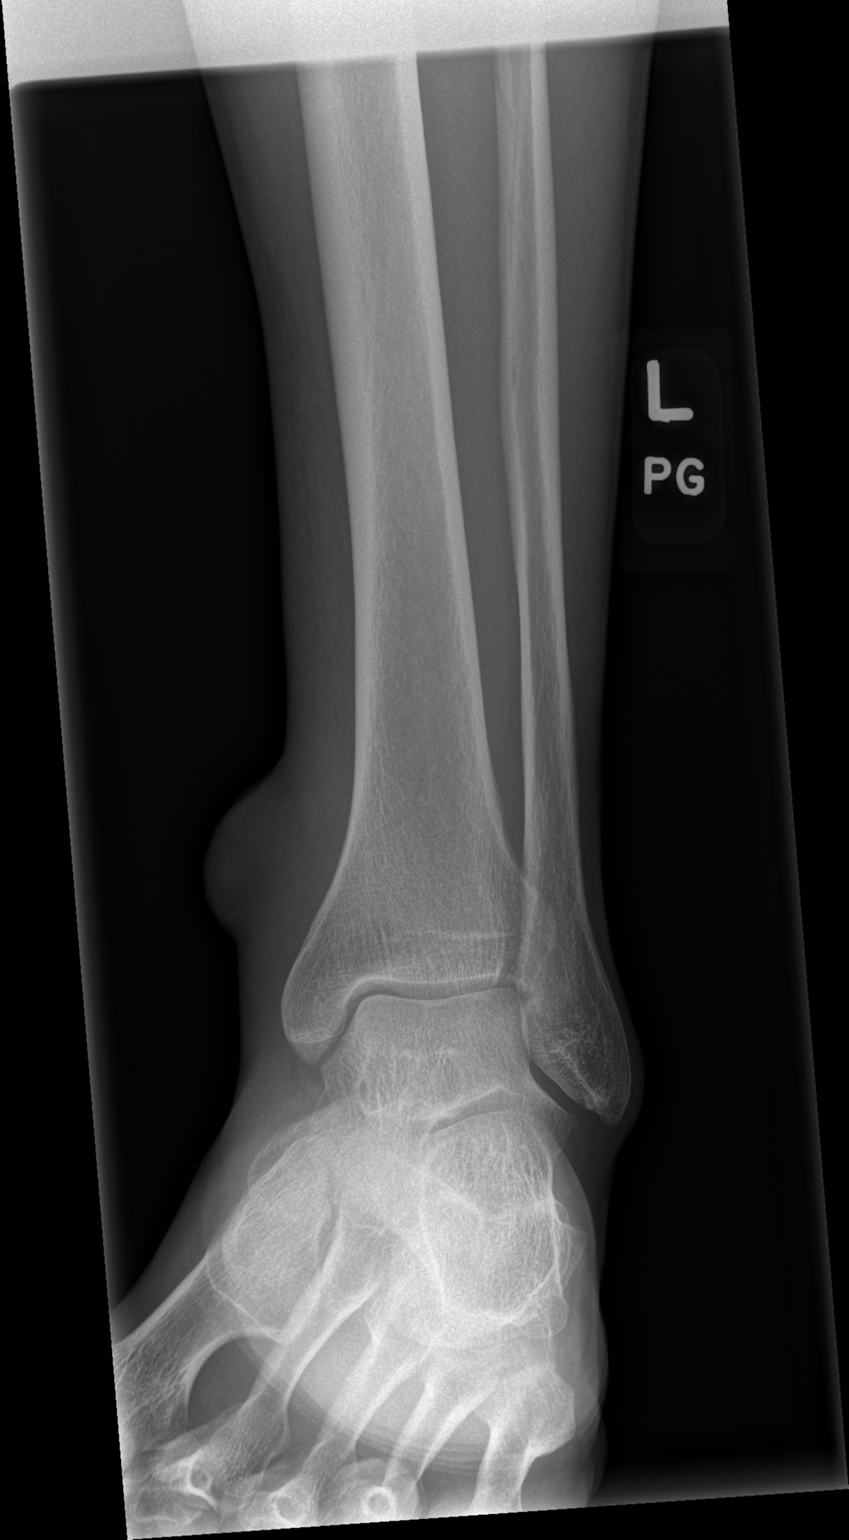

[x ankle lat left]
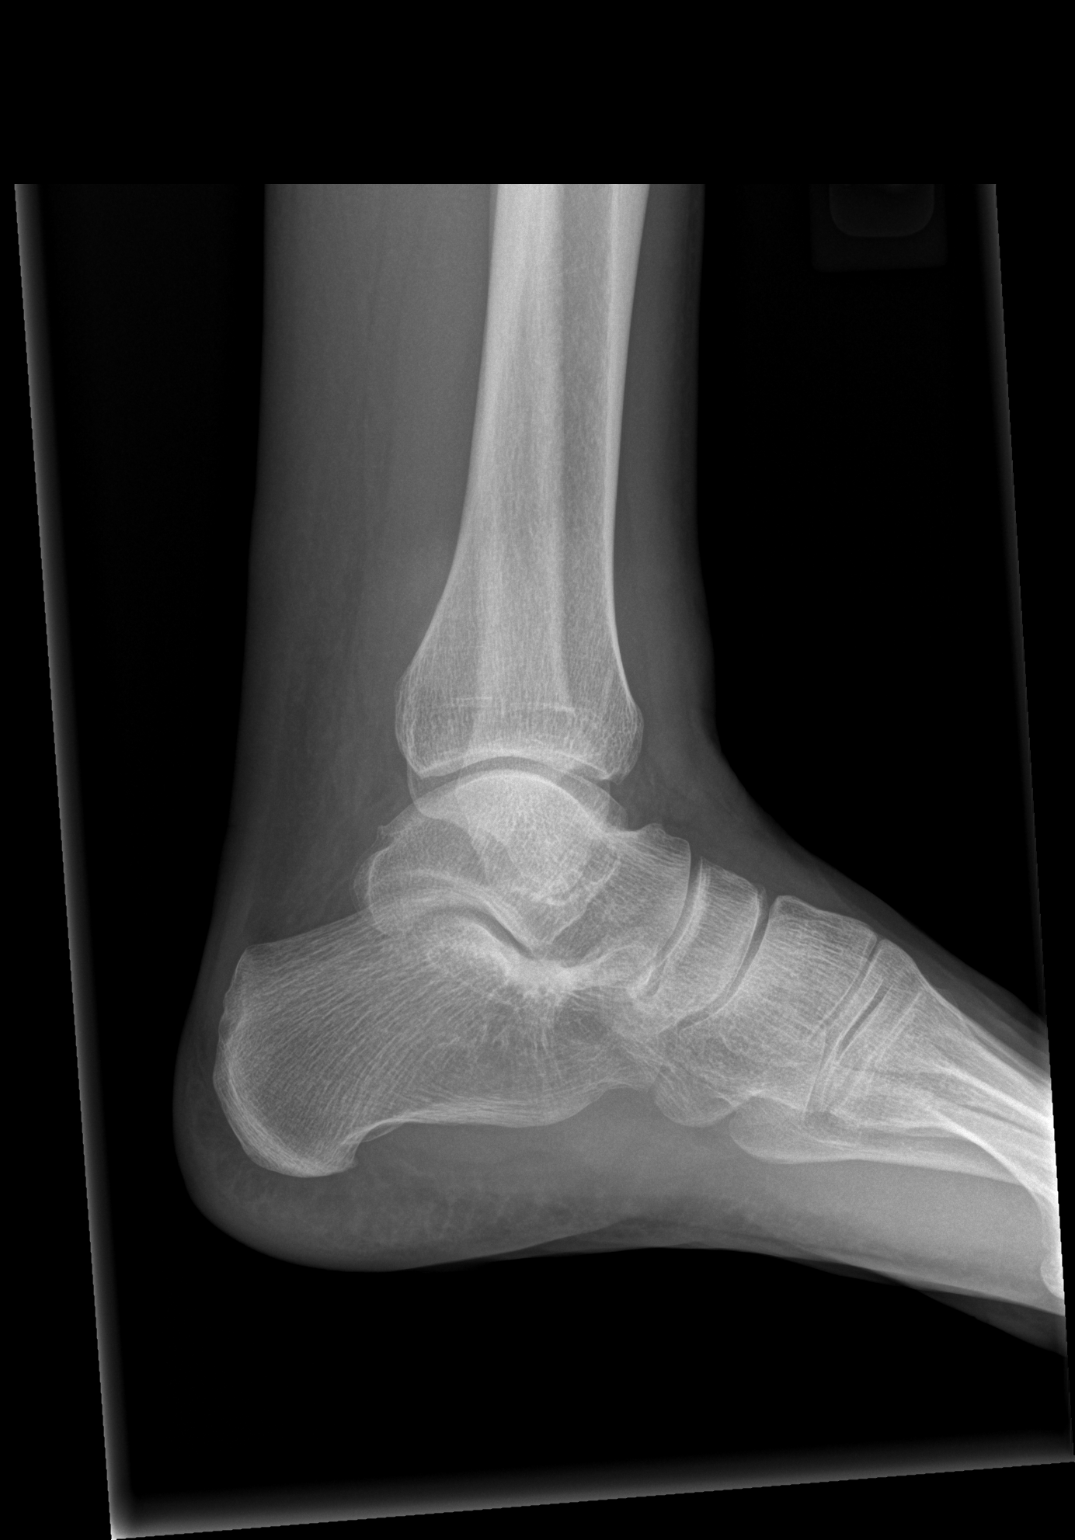

[3 of 3 positions shown; findings below may reference images not displayed]

FINDINGS: Frontal, oblique, lateral views of the left ankle are obtained.
There is diffuse soft tissue swelling, most pronounced medially. No
acute or destructive bony lesions. No periosteal reaction. The ankle
mortise is intact.
IMPRESSION: 1. Prominent soft tissue swelling, greatest medially.
2. No acute or destructive bony abnormality.

## 2022-11-28 DEATH — deceased
